# Patient Record
Sex: Male | Born: 1995 | Race: White | Hispanic: No | Marital: Single | Smoking: Never smoker
Health system: Southern US, Community
[De-identification: ages and names within clinical notes are randomized; demographics above are authoritative.]

## PROBLEM LIST (undated history)

## (undated) DIAGNOSIS — S069XAA Unspecified intracranial injury with loss of consciousness status unknown, initial encounter: Secondary | ICD-10-CM

## (undated) DIAGNOSIS — F419 Anxiety disorder, unspecified: Secondary | ICD-10-CM

## (undated) DIAGNOSIS — G4730 Sleep apnea, unspecified: Secondary | ICD-10-CM

## (undated) DIAGNOSIS — F32A Depression, unspecified: Secondary | ICD-10-CM

## (undated) DIAGNOSIS — F4310 Post-traumatic stress disorder, unspecified: Secondary | ICD-10-CM

## (undated) HISTORY — PX: ANTERIOR CRUCIATE LIGAMENT REPAIR: SHX115

---
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
## 2019-07-14 ENCOUNTER — Emergency Department (HOSPITAL_COMMUNITY)
Admission: EM | Admit: 2019-07-14 | Discharge: 2019-07-14 | Disposition: A | Discharge status: Home / Self Care | Payer: No Typology Code available for payment source | Attending: Emergency Medicine | Admitting: Emergency Medicine

## 2019-07-14 ENCOUNTER — Other Ambulatory Visit: Payer: Self-pay

## 2019-07-14 DIAGNOSIS — F10929 Alcohol use, unspecified with intoxication, unspecified: Secondary | ICD-10-CM | POA: Insufficient documentation

## 2019-07-14 DIAGNOSIS — R55 Syncope and collapse: Secondary | ICD-10-CM | POA: Diagnosis present

## 2019-07-14 DIAGNOSIS — Y902 Blood alcohol level of 40-59 mg/100 ml: Secondary | ICD-10-CM | POA: Insufficient documentation

## 2019-07-14 LAB — CBC
HCT: 45.2 % (ref 39.0–52.0)
Hemoglobin: 14.7 g/dL (ref 13.0–17.0)
MCH: 28.7 pg (ref 26.0–34.0)
MCHC: 32.5 g/dL (ref 30.0–36.0)
MCV: 88.1 fL (ref 80.0–100.0)
Platelets: 323 10*3/uL (ref 150–400)
RBC: 5.13 MIL/uL (ref 4.22–5.81)
RDW: 12.8 % (ref 11.5–15.5)
WBC: 9.8 10*3/uL (ref 4.0–10.5)
nRBC: 0 % (ref 0.0–0.2)

## 2019-07-14 LAB — URINALYSIS, ROUTINE W REFLEX MICROSCOPIC
Bilirubin Urine: NEGATIVE
Glucose, UA: NEGATIVE mg/dL
Hgb urine dipstick: NEGATIVE
Ketones, ur: NEGATIVE mg/dL
Leukocytes,Ua: NEGATIVE
Nitrite: NEGATIVE
Protein, ur: NEGATIVE mg/dL
Specific Gravity, Urine: 1.002 — ABNORMAL LOW (ref 1.005–1.030)
pH: 6 (ref 5.0–8.0)

## 2019-07-14 LAB — BASIC METABOLIC PANEL
Anion gap: 11 (ref 5–15)
BUN: 11 mg/dL (ref 6–20)
CO2: 22 mmol/L (ref 22–32)
Calcium: 9 mg/dL (ref 8.9–10.3)
Chloride: 108 mmol/L (ref 98–111)
Creatinine, Ser: 0.86 mg/dL (ref 0.61–1.24)
GFR calc Af Amer: >60 mL/min (ref >60)
GFR calc non Af Amer: >60 mL/min (ref >60)
Glucose, Bld: 98 mg/dL (ref 70–99)
Potassium: 3.8 mmol/L (ref 3.5–5.1)
Sodium: 141 mmol/L (ref 135–145)

## 2019-07-14 LAB — ETHANOL: Alcohol, Ethyl (B): 77 mg/dL — ABNORMAL HIGH (ref <10)

## 2019-07-14 LAB — RAPID URINE DRUG SCREEN, HOSP PERFORMED
Amphetamines: NOT DETECTED
Barbiturates: NOT DETECTED
Benzodiazepines: NOT DETECTED
Cocaine: NOT DETECTED
Opiates: NOT DETECTED
Tetrahydrocannabinol: NOT DETECTED

## 2019-07-14 LAB — CBG MONITORING, ED: Glucose-Capillary: 99 mg/dL (ref 70–99)

## 2019-07-14 MED ORDER — SODIUM CHLORIDE 0.9% FLUSH
3.0000 mL | Freq: Once | INTRAVENOUS | Status: AC
  Administered 2019-07-14: 06:00:00 3 mL via INTRAVENOUS

## 2019-07-14 NOTE — Discharge Instructions (Signed)
Your work-up in the emergency department has been reassuring.  We advise against binge drinking as well as the use of illicit drugs as this can contribute to passing out.  Drink plenty of water to prevent dehydration.  You have been given a referral to neurology given your reported history of seizure-like activity prior to this evening.  You would also benefit from continued follow-up with a primary care doctor regarding management of your stress/anxiety and PTSD.  Return to the ED for new or concerning symptoms.

## 2019-07-14 NOTE — ED Triage Notes (Signed)
Pt to ED with c/o of syncope. Pt passed out and fell to the ground at 0309 with friends. Pt does have EOTH on board and a history of syncopal episodes.
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
## 2023-10-05 ENCOUNTER — Other Ambulatory Visit: Payer: Self-pay

## 2023-10-05 ENCOUNTER — Encounter (HOSPITAL_BASED_OUTPATIENT_CLINIC_OR_DEPARTMENT_OTHER): Payer: Self-pay

## 2023-10-05 ENCOUNTER — Emergency Department (HOSPITAL_BASED_OUTPATIENT_CLINIC_OR_DEPARTMENT_OTHER)
Admission: EM | Admit: 2023-10-05 | Discharge: 2023-10-05 | Disposition: A | Discharge status: Home / Self Care | Attending: Emergency Medicine | Admitting: Emergency Medicine

## 2023-10-05 DIAGNOSIS — R1031 Right lower quadrant pain: Secondary | ICD-10-CM | POA: Diagnosis not present

## 2023-10-05 DIAGNOSIS — R1032 Left lower quadrant pain: Secondary | ICD-10-CM | POA: Diagnosis not present

## 2023-10-05 DIAGNOSIS — R195 Other fecal abnormalities: Secondary | ICD-10-CM | POA: Diagnosis not present

## 2023-10-05 DIAGNOSIS — R1013 Epigastric pain: Secondary | ICD-10-CM | POA: Insufficient documentation

## 2023-10-05 HISTORY — DX: Post-traumatic stress disorder, unspecified: F43.10

## 2023-10-05 HISTORY — DX: Sleep apnea, unspecified: G47.30

## 2023-10-05 HISTORY — DX: Depression, unspecified: F32.A

## 2023-10-05 HISTORY — DX: Unspecified intracranial injury with loss of consciousness status unknown, initial encounter: S06.9XAA

## 2023-10-05 HISTORY — DX: Anxiety disorder, unspecified: F41.9

## 2023-10-05 LAB — COMPREHENSIVE METABOLIC PANEL WITH GFR
ALT: 30 U/L (ref 0–44)
AST: 26 U/L (ref 15–41)
Albumin: 4.6 g/dL (ref 3.5–5.0)
Alkaline Phosphatase: 73 U/L (ref 38–126)
Anion gap: 13 (ref 5–15)
BUN: 14 mg/dL (ref 6–20)
CO2: 22 mmol/L (ref 22–32)
Calcium: 10.1 mg/dL (ref 8.9–10.3)
Chloride: 102 mmol/L (ref 98–111)
Creatinine, Ser: 0.95 mg/dL (ref 0.61–1.24)
GFR, Estimated: >60 mL/min (ref >60)
Glucose, Bld: 92 mg/dL (ref 70–99)
Potassium: 4 mmol/L (ref 3.5–5.1)
Sodium: 137 mmol/L (ref 135–145)
Total Bilirubin: 1 mg/dL (ref 0.0–1.2)
Total Protein: 7.9 g/dL (ref 6.5–8.1)

## 2023-10-05 LAB — CBC
HCT: 44.4 % (ref 39.0–52.0)
Hemoglobin: 15.2 g/dL (ref 13.0–17.0)
MCH: 28.7 pg (ref 26.0–34.0)
MCHC: 34.2 g/dL (ref 30.0–36.0)
MCV: 83.9 fL (ref 80.0–100.0)
Platelets: 341 10*3/uL (ref 150–400)
RBC: 5.29 MIL/uL (ref 4.22–5.81)
RDW: 13 % (ref 11.5–15.5)
WBC: 9.2 10*3/uL (ref 4.0–10.5)
nRBC: 0 % (ref 0.0–0.2)

## 2023-10-05 LAB — LIPASE, BLOOD: Lipase: 27 U/L (ref 11–51)

## 2023-10-05 MED ORDER — OMEPRAZOLE 20 MG PO CPDR: 20.0000 mg | DELAYED_RELEASE_CAPSULE | Freq: Every day | ORAL | 1 refills | Status: AC

## 2023-10-05 NOTE — ED Triage Notes (Signed)
 Pt reports clay/white colored stools for the past 1-2 days along with pressure/reflux like feeling in throat as well as epigastric area and lower abdominal area. Pt also reports swimmy, disoriented, prickly feeling in his head starting in temples and working into his jaw. Pt reports hx of TBI with certain residual symptoms and that this is nothing like those symptoms. Pt awake and alert, A&Ox4, in NAD in triage. Not on any regular medications.

## 2023-10-05 NOTE — ED Provider Notes (Signed)
 [facility]GENCY [facility] Emergency Department Provider Note MRN:  [id number]  Arrival date & time: 10/05/23     Chief Complaint   Abdominal Pain and Headache   History of Present Illness   Mario Grimes is a 28 y.o. year-old male with a history of PTSD, TBI presenting to the ED with chief complaint of abdominal pain.  Abdominal pain in the epigastrium and also in the lower quadrants on and off today.  Also a sharp tingling feeling to the bilateral temples radiating down to the jaw this evening, has never had that feeling before.  Also felt very anxious right before bed, worried that something is wrong.  Also stool has been a very light brown or clay colored recently.  Review of Systems  A thorough review of systems was obtained and all systems are negative except as noted in the HPI and PMH.   Patient's Health History    Past Medical History:  Diagnosis Date   Anxiety    Depression    PTSD (post-traumatic stress disorder)    Sleep apnea    TBI (traumatic brain injury) (HCC)     History reviewed. No pertinent surgical history.  History reviewed. No pertinent family history.  Social History   Socioeconomic History   Marital status: Single    Spouse name: Not on file   Number of children: Not on file   Years of education: Not on file   Highest education level: Not on file  Occupational History   Not on file  Tobacco Use   Smoking status: Not on file   Smokeless tobacco: Not on file  Substance and Sexual Activity   Alcohol use: Not on file   Drug use: Not on file   Sexual activity: Not on file  Other Topics Concern   Not on file  Social History Narrative   Not on file   Social Drivers of Health   Financial Resource Strain: Medium Risk (01/21/2023)   Received from [facility]   Overall Financial Resource Strain (CARDIA)    Difficulty of Paying Living Expenses: Somewhat hard  Food Insecurity: Food Insecurity Present (01/21/2023)   Received from [facility]   Hunger Vital Sign    Within the past 12 months, you worried that your food would run out before you got the money to buy more.: Often true    Within the past 12 months, the food you bought just didn't last and you didn't have money to get more.: Often true  Transportation Needs: No Transportation Needs (01/21/2023)   Received from [facility] - Transportation    Lack of Transportation (Medical): No    Lack of Transportation (Non-Medical): No  Physical Activity: Insufficiently Active (01/21/2023)   Received from [facility]   Exercise Vital Sign    On average, how many days per week do you engage in moderate to strenuous exercise (like a brisk walk)?: 2 days    On average, how many minutes do you engage in exercise at this level?: 60 min  Stress: Stress Concern Present (01/21/2023)   Received from [facility] - Occupational Stress Questionnaire    Feeling of Stress : To some extent  Social Connections: Somewhat Isolated (01/21/2023)   Received from [facility]   Social Network    How would you rate your social network (family, work, friends)?: Restricted participation with some degree of social isolation  Intimate Partner Violence: Not At Risk (  01/21/2023)   Received from [facility]   HITS    Over the last 12 months how often did your partner physically hurt you?: Never    Over the last 12 months how often did your partner insult you or talk down to you?: Never    Over the last 12 months how often did your partner threaten you with physical harm?: Never    Over the last 12 months how often did your partner scream or curse at you?: Never     Physical Exam   Vitals:   10/05/23 0213  BP: (!) 126/90  Pulse: 86  Resp: 17  Temp: 98.5 F (36.9 C)  SpO2: 98%    CONSTITUTIONAL: Well-appearing, NAD NEURO/PSYCH:  Alert and oriented x 3, no focal deficits EYES:  eyes equal and reactive ENT/NECK:  no LAD, no  JVD CARDIO: Regular rate, well-perfused, normal S1 and S2 PULM:  CTAB no wheezing or rhonchi GI/GU:  non-distended, non-tender MSK/SPINE:  No gross deformities, no edema SKIN:  no rash, atraumatic   *Additional and/or pertinent findings included in MDM below  Diagnostic and Interventional Summary    EKG Interpretation Date/Time:    Ventricular Rate:    PR Interval:    QRS Duration:    QT Interval:    QTC Calculation:   R Axis:      Text Interpretation:         Labs Reviewed  CBC  COMPREHENSIVE METABOLIC PANEL WITH GFR  LIPASE, BLOOD    No orders to display    Medications - No data to display   Procedures  /  Critical Care Procedures  ED Course and Medical Decision Making  Initial Impression and Ddx Well-appearing in no acute distress with normal vital signs.  Abdomen is soft and nontender with no rebound guarding or rigidity.  Normal and symmetric strength and sensation, normal coordination, normal speech.  With the light-colored stool must consider biliary obstruction, will check labs.  Overall favoring anxiety component to this emergency department visit.  Low concern for emergent process.  Past medical/surgical history that increases complexity of ED encounter: None  Interpretation of Diagnostics I personally reviewed the Laboratory Testing and my interpretation is as follows: No significant blood count or electrolyte disturbance.    Patient Reassessment and Ultimate Disposition/Management     Shared decision making utilized and CT imaging offered however felt to be low yield.  Patient agrees with close observation of symptoms at home, follow-up with PCP.  Appropriate for discharge.  Patient management required discussion with the following services or consulting groups:  None  Complexity of Problems Addressed Acute illness or injury that poses threat of life of bodily function  Additional Data Reviewed and Analyzed Further history obtained  from: Further history from spouse/family member  Additional Factors Impacting ED Encounter Risk Prescriptions  Merrick Abe. Harless Lien, MD [facility] Emergency Medicine [facility] [email or web address]  Final Clinical Impressions(s) / ED Diagnoses     ICD-10-CM   1. Clay-colored stools  R19.5       ED Discharge Orders          Ordered    omeprazole (PRILOSEC) 20 MG capsule  Daily        10/05/23 0441             Discharge Instructions Discussed with and Provided to Patient:    Discharge Instructions      You were evaluated in the Emergency Department and after careful evaluation, we did not  find any emergent condition requiring admission or further testing in the hospital.  Your exam/testing today is overall reassuring.  Can use the omeprazole daily to help prevent or control acid reflux related discomfort.  Please return to the Emergency Department if you experience any worsening of your condition.   Thank you for allowing us  to be a part of your care.      Edson Graces, MD 10/05/23 [phone]

## 2023-10-05 NOTE — Discharge Instructions (Addendum)
 You were evaluated in the Emergency Department and after careful evaluation, we did not find any emergent condition requiring admission or further testing in the hospital.  Your exam/testing today is overall reassuring.  Can use the omeprazole daily to help prevent or control acid reflux related discomfort.  Please return to the Emergency Department if you experience any worsening of your condition.   Thank you for allowing us  to be a part of your care.

## 2024-01-21 ENCOUNTER — Ambulatory Visit (HOSPITAL_COMMUNITY)
Admission: EM | Admit: 2024-01-21 | Discharge: 2024-01-21 | Disposition: A | Discharge status: Home / Self Care | Attending: Family Medicine | Admitting: Family Medicine

## 2024-01-21 ENCOUNTER — Encounter (HOSPITAL_COMMUNITY): Payer: Self-pay

## 2024-01-21 DIAGNOSIS — K120 Recurrent oral aphthae: Secondary | ICD-10-CM | POA: Diagnosis not present

## 2024-01-21 DIAGNOSIS — R21 Rash and other nonspecific skin eruption: Secondary | ICD-10-CM

## 2024-01-21 MED ORDER — METHYLPREDNISOLONE 4 MG PO TBPK: ORAL_TABLET | ORAL | 0 refills | Status: AC

## 2024-01-21 NOTE — ED Triage Notes (Addendum)
 Patient reports that he has blisters to both hands,  mouth, and blister to the left foot x 2 days. Patient states he has an ulcer to the side of his tongue, body aches , and chills. Patient states he has also been handling mouse traps with bare hands

## 2024-01-21 NOTE — Discharge Instructions (Signed)
 You may use over the counter ibuprofen or acetaminophen as needed.  For a sore throat, over the counter products such as [employer] Peroxyl Mouth Sore Rinse or Chloraseptic Sore Throat Spray may provide some temporary relief.

## 2024-01-25 NOTE — ED Provider Notes (Signed)
 [facility] CARE CENTER   [id number] 01/21/24 Arrival Time: 1622  ASSESSMENT & PLAN:  1. Oral aphthous ulcer   2. Rash and nonspecific skin eruption    Unclear etiology. Trial of: Meds ordered this encounter  Medications   methylPREDNISolone (MEDROL DOSEPAK) 4 MG TBPK tablet    Sig: Take as directed.    Dispense:  1 each    Refill:  0   =  Will follow up with PCP or here if worsening or failing to improve as anticipated. Reviewed expectations re: course of current medical issues. Questions answered. Outlined signs and symptoms indicating need for more acute intervention. Patient verbalized understanding. After Visit Summary given.   SUBJECTIVE:  Mario Grimes is a 28 y.o. male who presents with a skin complaint. Patient reports that he has blisters to both hands,  mouth, and blister to the left foot x 2 days. Patient states he has an ulcer to the side of his tongue, body aches , and chills. Patient states he has also been handling mouse traps with bare hands Denies fever or new exposures.     OBJECTIVE: Vitals:   01/21/24 1705  BP: 129/82  Pulse: 98  Resp: 16  Temp: 98.2 F (36.8 C)  TempSrc: Oral  SpO2: 96%    General appearance: alert; no distress HEENT: [place]; AT; throat with a few aphthous ulcers Neck: supple with FROM Lungs: clear to auscultation bilaterally Heart: regular rate and rhythm Extremities: no edema; moves all extremities normally Skin: warm and dry; slight non-spec blistering of palmar fingers; without bleeding; without signs of infection Psychological: alert and cooperative; normal mood and affect  No Known Allergies  Past Medical History:  Diagnosis Date   Anxiety    Depression    PTSD (post-traumatic stress disorder)    Sleep apnea    TBI (traumatic brain injury) (HCC)    Social History   Socioeconomic History   Marital status: Single    Spouse name: Not on file   Number of children: Not on file   Years of education: Not on file    Highest education level: Not on file  Occupational History   Not on file  Tobacco Use   Smoking status: Never   Smokeless tobacco: Current    Types: Snuff  Vaping Use   Vaping status: Never Used  Substance and Sexual Activity   Alcohol use: Yes   Drug use: Yes    Comment: DElta 8 Gummies   Sexual activity: Not on file  Other Topics Concern   Not on file  Social History Narrative   Not on file   Social Drivers of Health   Financial Resource Strain: Medium Risk (01/21/2023)   Received from [facility]   Overall Financial Resource Strain (CARDIA)    Difficulty of Paying Living Expenses: Somewhat hard  Food Insecurity: Food Insecurity Present (01/21/2023)   Received from [facility]   Hunger Vital Sign    Within the past 12 months, you worried that your food would run out before you got the money to buy more.: Often true    Within the past 12 months, the food you bought just didn't last and you didn't have money to get more.: Often true  Transportation Needs: No Transportation Needs (01/21/2023)   Received from [facility] - Transportation    Lack of Transportation (Medical): No    Lack of Transportation (Non-Medical): No  Physical Activity: Insufficiently Active (01/21/2023)   Received from [facility]  Exercise Vital Sign    On average, how many days per week do you engage in moderate to strenuous exercise (like a brisk walk)?: 2 days    On average, how many minutes do you engage in exercise at this level?: 60 min  Stress: Stress Concern Present (01/21/2023)   Received from [facility] of Occupational Health - Occupational Stress Questionnaire    Feeling of Stress : To some extent  Social Connections: Somewhat Isolated (01/21/2023)   Received from [facility]   Social Network    How would you rate your social network (family, work, friends)?: Restricted participation with some degree of social isolation  Intimate Partner Violence:  Not At Risk (01/21/2023)   Received from [facility]   HITS    Over the last 12 months how often did your partner physically hurt you?: Never    Over the last 12 months how often did your partner insult you or talk down to you?: Never    Over the last 12 months how often did your partner threaten you with physical harm?: Never    Over the last 12 months how often did your partner scream or curse at you?: Never   History reviewed. No pertinent family history. Past Surgical History:  Procedure Laterality Date   ANTERIOR CRUCIATE LIGAMENT REPAIR Left       Rolinda Rogue, MD 01/25/24 [phone]

## 2024-03-08 ENCOUNTER — Emergency Department (HOSPITAL_BASED_OUTPATIENT_CLINIC_OR_DEPARTMENT_OTHER)
Admission: EM | Admit: 2024-03-08 | Discharge: 2024-03-08 | Disposition: A | Discharge status: Home / Self Care | Attending: Emergency Medicine | Admitting: Emergency Medicine

## 2024-03-08 ENCOUNTER — Other Ambulatory Visit: Payer: Self-pay

## 2024-03-08 DIAGNOSIS — R519 Headache, unspecified: Secondary | ICD-10-CM | POA: Diagnosis not present

## 2024-03-08 DIAGNOSIS — R000 Tachycardia, unspecified: Secondary | ICD-10-CM | POA: Insufficient documentation

## 2024-03-08 DIAGNOSIS — R112 Nausea with vomiting, unspecified: Secondary | ICD-10-CM | POA: Diagnosis present

## 2024-03-08 DIAGNOSIS — R509 Fever, unspecified: Secondary | ICD-10-CM | POA: Diagnosis not present

## 2024-03-08 DIAGNOSIS — M7918 Myalgia, other site: Secondary | ICD-10-CM | POA: Insufficient documentation

## 2024-03-08 DIAGNOSIS — R197 Diarrhea, unspecified: Secondary | ICD-10-CM | POA: Insufficient documentation

## 2024-03-08 LAB — CBC WITH DIFFERENTIAL/PLATELET
Abs Immature Granulocytes: 0.05 K/uL (ref 0.00–0.07)
Basophils Absolute: 0 K/uL (ref 0.0–0.1)
Basophils Relative: 0 %
Eosinophils Absolute: 0.1 K/uL (ref 0.0–0.5)
Eosinophils Relative: 0 %
HCT: 44.6 % (ref 39.0–52.0)
Hemoglobin: 15.2 g/dL (ref 13.0–17.0)
Immature Granulocytes: 0 %
Lymphocytes Relative: 4 %
Lymphs Abs: 0.6 K/uL — ABNORMAL LOW (ref 0.7–4.0)
MCH: 29.3 pg (ref 26.0–34.0)
MCHC: 34.1 g/dL (ref 30.0–36.0)
MCV: 86.1 fL (ref 80.0–100.0)
Monocytes Absolute: 0.5 K/uL (ref 0.1–1.0)
Monocytes Relative: 4 %
Neutro Abs: 13.8 K/uL — ABNORMAL HIGH (ref 1.7–7.7)
Neutrophils Relative %: 92 %
Platelets: 281 K/uL (ref 150–400)
RBC: 5.18 MIL/uL (ref 4.22–5.81)
RDW: 13.2 % (ref 11.5–15.5)
WBC: 15 K/uL — ABNORMAL HIGH (ref 4.0–10.5)
nRBC: 0 % (ref 0.0–0.2)

## 2024-03-08 LAB — COMPREHENSIVE METABOLIC PANEL WITH GFR
ALT: 42 U/L (ref 0–44)
AST: 30 U/L (ref 15–41)
Albumin: 4.5 g/dL (ref 3.5–5.0)
Alkaline Phosphatase: 75 U/L (ref 38–126)
Anion gap: 13 (ref 5–15)
BUN: 11 mg/dL (ref 6–20)
CO2: 26 mmol/L (ref 22–32)
Calcium: 9.3 mg/dL (ref 8.9–10.3)
Chloride: 101 mmol/L (ref 98–111)
Creatinine, Ser: 1.06 mg/dL (ref 0.61–1.24)
GFR, Estimated: >60 mL/min (ref >60)
Glucose, Bld: 102 mg/dL — ABNORMAL HIGH (ref 70–99)
Potassium: 4 mmol/L (ref 3.5–5.1)
Sodium: 139 mmol/L (ref 135–145)
Total Bilirubin: 1.6 mg/dL — ABNORMAL HIGH (ref 0.0–1.2)
Total Protein: 7.1 g/dL (ref 6.5–8.1)

## 2024-03-08 LAB — URINALYSIS, ROUTINE W REFLEX MICROSCOPIC
Bacteria, UA: NONE SEEN
Bilirubin Urine: NEGATIVE
Glucose, UA: NEGATIVE mg/dL
Hgb urine dipstick: NEGATIVE
Ketones, ur: NEGATIVE mg/dL
Leukocytes,Ua: NEGATIVE
Nitrite: NEGATIVE
Specific Gravity, Urine: 1.031 — ABNORMAL HIGH (ref 1.005–1.030)
pH: 7 (ref 5.0–8.0)

## 2024-03-08 LAB — LIPASE, BLOOD: Lipase: 18 U/L (ref 11–51)

## 2024-03-08 LAB — RESP PANEL BY RT-PCR (RSV, FLU A&B, COVID)  RVPGX2
Influenza A by PCR: NEGATIVE
Influenza B by PCR: NEGATIVE
Resp Syncytial Virus by PCR: NEGATIVE
SARS Coronavirus 2 by RT PCR: NEGATIVE

## 2024-03-08 MED ORDER — KETOROLAC TROMETHAMINE 15 MG/ML IJ SOLN
15.0000 mg | Freq: Once | INTRAMUSCULAR | Status: AC
  Administered 2024-03-08: 15 mg via INTRAVENOUS
  Filled 2024-03-08: qty 1

## 2024-03-08 MED ORDER — SODIUM CHLORIDE 0.9 % IV BOLUS
1000.0000 mL | Freq: Once | INTRAVENOUS | Status: AC
  Administered 2024-03-08: 1000 mL via INTRAVENOUS

## 2024-03-08 MED ORDER — ONDANSETRON HCL 4 MG PO TABS: 4.0000 mg | ORAL_TABLET | Freq: Four times a day (QID) | ORAL | 0 refills | Status: AC

## 2024-03-08 MED ORDER — ONDANSETRON HCL 4 MG/2ML IJ SOLN
4.0000 mg | Freq: Once | INTRAMUSCULAR | Status: AC
  Administered 2024-03-08: 4 mg via INTRAVENOUS
  Filled 2024-03-08: qty 2

## 2024-03-08 MED ORDER — ONDANSETRON 4 MG PO TBDP: 4.0000 mg | ORAL_TABLET | Freq: Once | ORAL | Status: DC | PRN

## 2024-03-08 NOTE — ED Triage Notes (Signed)
 Patient reports vomiting and diarrhea beginning this morning. Also endorses body aches and chills.

## 2024-03-08 NOTE — ED Provider Notes (Signed)
 [place] [facility] AT [facility] Provider Note   CSN: [id number] Arrival date & time: 03/08/24  1213     Patient presents with: Emesis   Mario Grimes is a 28 y.o. male.   Patient is a 28 year old male with a history of anxiety and PTSD who presents to the ED for nausea/vomiting/diarrhea that began around 3:30 AM this morning.  Patient notes he woke up around this time with abdominal cramping and had to go to the bathroom.  He notes he has had watery diarrhea alternating with watery vomit since.  He states he tried to drink some Pedialyte this morning but has not been able to tolerate anything orally.  Denies any changes in diet yesterday and no one else was sick from similar food yesterday.  Denies known sick contacts.  No previous abdominal surgeries.  Notes subjective fevers.  Also notes a headache.  Denies dizziness, syncope, chest pain, shortness of breath, abdominal pain, hematemesis, hematochezia, urinary symptoms.  No further complaints.   Emesis Associated symptoms: chills, diarrhea, fever, headaches and myalgias   Associated symptoms: no abdominal pain and no cough        Prior to Admission medications   Medication Sig Start Date End Date Taking? Authorizing Provider  ondansetron  (ZOFRAN ) 4 MG tablet Take 1 tablet (4 mg total) by mouth every 6 (six) hours. 03/08/24  Yes Nashayla Telleria, Thersia RAMAN, PA-C  desvenlafaxine (PRISTIQ) 100 MG 24 hr tablet Take 100 mg by mouth daily.    [provider]  methylPREDNISolone  (MEDROL  DOSEPAK) 4 MG TBPK tablet Take as directed. 01/21/24   Rolinda Rogue, MD  omeprazole  (PRILOSEC) 20 MG capsule Take 1 capsule (20 mg total) by mouth daily. Patient not taking: Reported on 01/21/2024 10/05/23   Theadore Ozell HERO, MD    Allergies: Patient has no known allergies.    Review of Systems  Constitutional:  Positive for chills and fever.  HENT:  Negative for congestion.   Eyes:  Negative for visual disturbance.  Respiratory:   Negative for cough and shortness of breath.   Cardiovascular:  Negative for chest pain.  Gastrointestinal:  Positive for diarrhea, nausea and vomiting. Negative for abdominal pain, blood in stool and constipation.  Genitourinary:  Negative for dysuria.  Musculoskeletal:  Positive for myalgias. Negative for back pain.  Neurological:  Positive for headaches. Negative for dizziness and syncope.    Updated Vital Signs BP (!) 101/54   Pulse (!) 111   Temp 99.7 F (37.6 C) (Oral)   Resp (!) 26   SpO2 95%   Physical Exam Constitutional:      Appearance: Normal appearance.  HENT:     Head: Normocephalic and atraumatic.     Nose: Nose normal.     Mouth/Throat:     Mouth: Mucous membranes are moist.     Pharynx: Oropharynx is clear.  Cardiovascular:     Rate and Rhythm: Tachycardia present.  Pulmonary:     Effort: Pulmonary effort is normal.  Abdominal:     General: Bowel sounds are normal.     Palpations: Abdomen is soft.     Tenderness: There is no abdominal tenderness.  Musculoskeletal:        General: Normal range of motion.  Skin:    General: Skin is warm and dry.  Neurological:     Mental Status: He is alert and oriented to person, place, and time.  Psychiatric:        Mood and Affect: Mood normal.  Behavior: Behavior normal.     (all labs ordered are listed, but only abnormal results are displayed) Labs Reviewed  COMPREHENSIVE METABOLIC PANEL WITH GFR - Abnormal; Notable for the following components:      Result Value   Glucose, Bld 102 (*)    Total Bilirubin 1.6 (*)    All other components within normal limits  CBC WITH DIFFERENTIAL/PLATELET - Abnormal; Notable for the following components:   WBC 15.0 (*)    Neutro Abs 13.8 (*)    Lymphs Abs 0.6 (*)    All other components within normal limits  URINALYSIS, ROUTINE W REFLEX MICROSCOPIC - Abnormal; Notable for the following components:   Specific Gravity, Urine 1.031 (*)    Protein, ur TRACE (*)    All  other components within normal limits  RESP PANEL BY RT-PCR (RSV, FLU A&B, COVID)  RVPGX2  LIPASE, BLOOD    EKG: None  Radiology: No results found.    Medications Ordered in the ED  sodium chloride  0.9 % bolus 1,000 mL (0 mLs Intravenous Stopped 03/08/24 1645)  ondansetron  (ZOFRAN ) injection 4 mg (4 mg Intravenous Given 03/08/24 1521)  ketorolac  (TORADOL ) 15 MG/ML injection 15 mg (15 mg Intravenous Given 03/08/24 1542)                                   Medical Decision Making Amount and/or Complexity of Data Reviewed Labs: ordered.  Risk Prescription drug management.   Patient is a 28 year old male who presents to the ED for nausea/vomiting/diarrhea that began around 3:30 AM this morning.  Please see detailed HPI above.  On exam patient is alert and in no acute distress.  Physical exam as noted above.  No acute abdominal tenderness appreciated.  Leukocytosis of 15 noted on lab workup.  Total bilirubin 1.6 but LFTs otherwise normal.  UA shows mild dehydration but otherwise no acute abnormalities.  Differential includes acute viral illness, gastritis,, food poisoning, IBS, IBD, acute abdomen.  No concerns for acute imaging as patient does not have any abdominal tenderness today and is tolerating oral intake.  Suspect patient's symptoms are viral in etiology due to acute onset.  He is mildly tachycardic but most likely secondary to dehydration.  Patient was given Zofran  and fluids while in ED and states he is feeling better.  Stable for discharge home.  Prescribed Zofran .  Symptomatic care discussed.  Advised PCP follow-up in 1 week if no improvement.  Return precautions provided.   Final diagnoses:  Vomiting and diarrhea    ED Discharge Orders          Ordered    ondansetron  (ZOFRAN ) 4 MG tablet  Every 6 hours        03/08/24 1801               Neysa Thersia RAMAN, PA-C 03/08/24 1806    Pamella Sharper A, DO 03/09/24 0021

## 2024-03-08 NOTE — Discharge Instructions (Signed)
 May take Zofran every 6-8 hours as needed for nausea/vomiting.  May take over-the-counter Imodium a couple times a day as needed for any diarrhea.  Stay hydrated and drink plenty of fluids including electrolytes over the next few days.  Follow-up with PCP in 1 week if no improvement.  Return to ED sooner if any symptoms worsen including uncontrollable vomiting/diarrhea, blood in the diarrhea, new fevers, severe abdominal pain.
